# Patient Record
Sex: Female | Born: 1960 | Race: Asian | Marital: Married | Smoking: Never smoker
Health system: Southern US, Community
[De-identification: ages and names within clinical notes are randomized; demographics above are authoritative.]

---

## 2021-05-03 ENCOUNTER — Other Ambulatory Visit: Payer: Self-pay | Admitting: Emergency Medicine

## 2021-05-03 ENCOUNTER — Other Ambulatory Visit: Payer: Self-pay

## 2021-05-03 ENCOUNTER — Ambulatory Visit
Admission: RE | Admit: 2021-05-03 | Discharge: 2021-05-03 | Disposition: A | Discharge status: Home / Self Care | Payer: Commercial Managed Care - PPO | Source: Ambulatory Visit | Attending: Emergency Medicine | Admitting: Emergency Medicine

## 2021-05-03 DIAGNOSIS — M25532 Pain in left wrist: Secondary | ICD-10-CM

## 2022-03-23 ENCOUNTER — Encounter: Payer: Self-pay | Admitting: Family Medicine

## 2022-03-23 ENCOUNTER — Ambulatory Visit: Payer: Self-pay

## 2022-03-23 ENCOUNTER — Ambulatory Visit (INDEPENDENT_AMBULATORY_CARE_PROVIDER_SITE_OTHER): Payer: Commercial Managed Care - PPO | Admitting: Family Medicine

## 2022-03-23 VITALS — BP 94/64 | HR 56 | Ht 67.0 in | Wt 169.0 lb

## 2022-03-23 DIAGNOSIS — M25471 Effusion, right ankle: Secondary | ICD-10-CM | POA: Diagnosis not present

## 2022-03-23 NOTE — Patient Instructions (Addendum)
Nice to meet you today. ? ?I've ordered a venous doppler ultrasound to check for blood clots. ? ?Please use Voltaren gel (Generic Diclofenac Gel) up to 4x daily for pain as needed.  This is available over-the-counter as both the name brand Voltaren gel and the generic diclofenac gel.  ? ?Follow-up: 2 weeks ?

## 2022-03-23 NOTE — Progress Notes (Signed)
? ?  I, Christoper Fabian, LAT, ATC, am serving as scribe for Dr. Clementeen Graham. ? ?Subjective:   ? ?CC: R ankle pain ? ?HPI: Pt is a 61 y/o female c/o R swelling x one month w/ no known MOI.  However, she did notice the swelling after returning from a cross country flight to/from [place].  Pt locates swelling to her R med and lateral ankle w/ some venous distension noted at her medial ankle.  While in [place] she did a lot of sitting watching tennis. ? ?R ankle swelling: yes and into her r foot as well ?Aggravates: pressure to the R medial ankle; prolonged exercise ?Treatments tried: R ankle AROM ? ?Pertinent review of Systems: No fevers or chills ? ?Relevant historical information: Otherwise healthy ? ? ?Objective:   ? ?Vitals:  ? 03/23/22 1415  ?BP: 94/64  ?Pulse: (!) 56  ?SpO2: 94%  ? ?General: Well Developed, well nourished, and in no acute distress.  ? ?MSK: Right foot and ankle: Mild erythema swelling and tenderness to the medial ankle and foot. ?Minimal venous distention present in this area. ?Normal foot and ankle motion. ?Nontender. ?Calf not particularly tender or swollen. ?Pulses are intact distally. ? ? ? ? ?Impression and Recommendations:   ? ?Assessment and Plan: ?61 y.o. female with right medial ankle pain and swelling.  This occurs in the setting of a cross-country flight and a fair amount of sitting.  This is somewhat concerning for DVT.  Plan for vascular ultrasound to evaluate for DVT.Marland Kitchen ?To recheck in 2 weeks.  Also recommend Voltaren gel. ? ? ?Discussed warning signs or symptoms. Please see discharge instructions. Patient expresses understanding. ? ? ?The above documentation has been reviewed and is accurate and complete Clementeen Graham, M.D. ? ?

## 2022-03-24 ENCOUNTER — Telehealth: Payer: Self-pay | Admitting: Family Medicine

## 2022-03-24 ENCOUNTER — Ambulatory Visit (HOSPITAL_COMMUNITY)
Admission: RE | Admit: 2022-03-24 | Discharge: 2022-03-24 | Disposition: A | Discharge status: Home / Self Care | Payer: Commercial Managed Care - PPO | Source: Ambulatory Visit | Attending: Family Medicine | Admitting: Family Medicine

## 2022-03-24 DIAGNOSIS — M25471 Effusion, right ankle: Secondary | ICD-10-CM

## 2022-03-24 MED ORDER — APIXABAN 5 MG PO TABS: 5.0000 mg | ORAL_TABLET | Freq: Two times a day (BID) | ORAL | 0 refills | Status: DC

## 2022-03-24 MED ORDER — APIXABAN (ELIQUIS) VTE STARTER PACK (10MG AND 5MG): ORAL_TABLET | ORAL | 0 refills | Status: DC

## 2022-03-24 NOTE — Telephone Encounter (Signed)
[facility] called with a preliminary report for venous doppler done today. Patient is positive for acute DVT in right popliteal vein. ? ?Advised patient that Dr Denyse Amass would be in contact with her on next steps. ? ?

## 2022-03-24 NOTE — Telephone Encounter (Signed)
Called pt about DVT. Rx Eliquis. Provided my cell number. Recheck with me in 2 weeks as scheduled.  ?

## 2022-03-27 NOTE — Progress Notes (Signed)
Vascular ultrasound of the right leg shows a DVT.  We discussed this on the phone on Friday..  Please start the blood thinner Eliquis that I prescribed.

## 2022-04-03 NOTE — Progress Notes (Signed)
? ?I, Wendy Poet, LAT, ATC, am serving as scribe for Dr. Lynne Leader. ? ?Ana Crawford is a 61 y.o. female who presents to [facility] today for f/u of R ankle swelling due to a popliteal vein DVT/  She was last seen by Dr. Georgina Snell on 03/23/22 and was referred for a doppler US after noticing R ankle and foot swelling following a cross-country flight to and from [place].  She was prescribed eliquis.  Today, pt reports that she is feeling well and thinks the R ankle swelling is improving.  She con't to take her Eliquis and will be leaving tomorrow to fly to [place]. ? ?She is already found a doctor at [facility] that she will establish care with (Dr Brigitte Pulse) ? ?Diagnostic testing: R LE venous doppler US- 03/24/22 ? ?Pertinent review of systems: No fevers or chills ? ?Relevant historical information: No prior DVT ? ? ?Exam:  ?BP 100/62 (BP Location: Right Arm, Patient Position: Sitting, Cuff Size: Normal)   Pulse (!) 50   Ht 5\' 7"  (1.702 m)   Wt 169 lb 12.8 oz (77 kg)   SpO2 95%   BMI 26.59 kg/m?  ?General: Well Developed, well nourished, and in no acute distress.  ? ?MSK: Right leg minimal swelling nontender. ? ? ? ?Lab and Radiology Results ?Lower Venous DVT Study  ? ?Patient Name:  Ana Crawford  Date of Exam:   03/24/2022  ?Medical Rec #: [id number]        Accession #:    [id number]  ?Date of Birth: May 05, 1961        Patient Gender: F  ?Patient Age:   61 years  ?Exam Location:  [facility]  ?Procedure:      VAS [place] LOWER EXTREMITY VENOUS (DVT)  ?Referring Phys: Ellard Artis Deshanti Adcox  ? ? ?---------------------------------------------------------------------------  ?-----  ?   ?Indications: Edema involving the right ankle for one week. Patient denies  ?pain.  ?   ?Performing Technologist: Ronal Fear RVS, RCS  ? ?   ?Examination Guidelines:  ?A complete evaluation includes B-mode imaging, spectral Doppler, color  ?Doppler,  ?and power Doppler as needed of all  accessible portions of each vessel.  ?Bilateral  ?testing is considered an integral part of a complete examination. Limited  ?examinations for reoccurring indications may be performed as noted. The  ?reflux  ?portion of the exam is performed with the patient in reverse  ?Trendelenburg.  ? ?   ? ?+---------+---------------+---------+-----------+---------------+----------  ?----+  ?RIGHT    CompressibilityPhasicitySpontaneityProperties     Thrombus  ?Aging  ?+---------+---------------+---------+-----------+---------------+----------  ?----+  ?CFV      Full                                                          ?      ?+---------+---------------+---------+-----------+---------------+----------  ?----+  ?SFJ      Full                                                          ?      ?+---------+---------------+---------+-----------+---------------+----------  ?----+  ?FV Prox  Full                                                          ?      ?+---------+---------------+---------+-----------+---------------+----------  ?----+  ?  FV Mid   Full                                                          ?      ?+---------+---------------+---------+-----------+---------------+----------  ?----+  ?FV DistalFull                                                          ?      ?+---------+---------------+---------+-----------+---------------+----------  ?----+  ?POP      Partial                            softly         Acute      ?      ?                                            echogenic        ?non-occlusive   ?+---------+---------------+---------+-----------+---------------+----------  ?----+  ?PTV      Full                                                          ?      ?+---------+---------------+---------+-----------+---------------+----------  ?----+  ?PERO     Full                                                          ?       ?+---------+---------------+---------+-----------+---------------+----------  ?----+  ?GSV      Full                                                          ?      ?+---------+---------------+---------+-----------+---------------+----------  ?----+  ?SSV      Full                                                          ?      ?+---------+---------------+---------+-----------+---------------+----------  ?----+  ? ?   ? ?   ? ? ? ?   ? ?Findings reported to [facility] at 2:15 pm.  ?   ?Summary:  ?RIGHT:  ?- Findings consistent with acute, non-occlusive deep vein thrombosis  ?involving the right popliteal vein.  ?- There is  no evidence of superficial venous thrombosis.  ?   ?LEFT:  ?- No evidence of common femoral vein obstruction.  ?   ? ?*See table(s) above for measurements and observations.  ? ?Electronically signed by Orlie Pollen on 03/24/2022 at 5:48:39 PM.  ?   ? ? ?  Final   ? ? ? ?Assessment and Plan: ?61 y.o. female with right popliteal DVT.  Patient was treated with Eliquis and is currently on the maintenance dose.  This is her first DVT and I believe it is provoked by a relative period of exercise followed by immobilization.  It is important for her to establish care with her primary care provider.  She already has selected Dr. Brigitte Pulse at [facility].  It is appropriate for him to take over anticoagulation.  If she has any issues I be happy to assist and continue prescribing Eliquis for a 3 to 53-month..  Recheck back with me as needed.  We discussed exercise.  Recommend compression stockings and graduated return to exercise at this time. ? ? ?PDMP not reviewed this encounter. ?No orders of the defined types were placed in this encounter. ? ?Meds ordered this encounter  ?Medications  ? apixaban (ELIQUIS) 5 MG TABS tablet  ?  Sig: Take 1 tablet (5 mg total) by mouth 2 (two) times daily.  ?  Dispense:  180 tablet  ?  Refill:  0  ? ? ? ?Discussed warning signs or symptoms. Please see  discharge instructions. Patient expresses understanding. ? ? ?The above documentation has been reviewed and is accurate and complete Lynne Leader, M.D. ? ? ?

## 2022-04-04 ENCOUNTER — Ambulatory Visit (INDEPENDENT_AMBULATORY_CARE_PROVIDER_SITE_OTHER): Payer: Commercial Managed Care - PPO | Admitting: Family Medicine

## 2022-04-04 ENCOUNTER — Encounter: Payer: Self-pay | Admitting: Family Medicine

## 2022-04-04 VITALS — BP 100/62 | HR 50 | Ht 67.0 in | Wt 169.8 lb

## 2022-04-04 DIAGNOSIS — J301 Allergic rhinitis due to pollen: Secondary | ICD-10-CM | POA: Insufficient documentation

## 2022-04-04 DIAGNOSIS — T781XXA Other adverse food reactions, not elsewhere classified, initial encounter: Secondary | ICD-10-CM | POA: Insufficient documentation

## 2022-04-04 DIAGNOSIS — I82431 Acute embolism and thrombosis of right popliteal vein: Secondary | ICD-10-CM | POA: Insufficient documentation

## 2022-04-04 DIAGNOSIS — K529 Noninfective gastroenteritis and colitis, unspecified: Secondary | ICD-10-CM | POA: Insufficient documentation

## 2022-04-04 MED ORDER — APIXABAN 5 MG PO TABS: 5.0000 mg | ORAL_TABLET | Freq: Two times a day (BID) | ORAL | 0 refills | Status: DC

## 2022-04-04 NOTE — Patient Instructions (Addendum)
Good to see you today. ? ?Recommend that you establish care w/ a primary care provider.   ? ?Follow-up: as needed.  ? ?Continue Eliquis for at least 3-6 months as guided by your primary doctor.  ?

## 2022-04-06 ENCOUNTER — Ambulatory Visit: Payer: Commercial Managed Care - PPO | Admitting: Family Medicine

## 2022-07-07 ENCOUNTER — Other Ambulatory Visit: Payer: Self-pay | Admitting: Family Medicine

## 2023-03-18 IMAGING — CR DG WRIST COMPLETE 3+V*L*
5 series · 5 of 5 positions shown · non-contrast
Comparison: None.

CLINICAL DATA: Left wrist pain

EXAM:
LEFT WRIST - COMPLETE 3+ VIEW

[x wrist pa left]
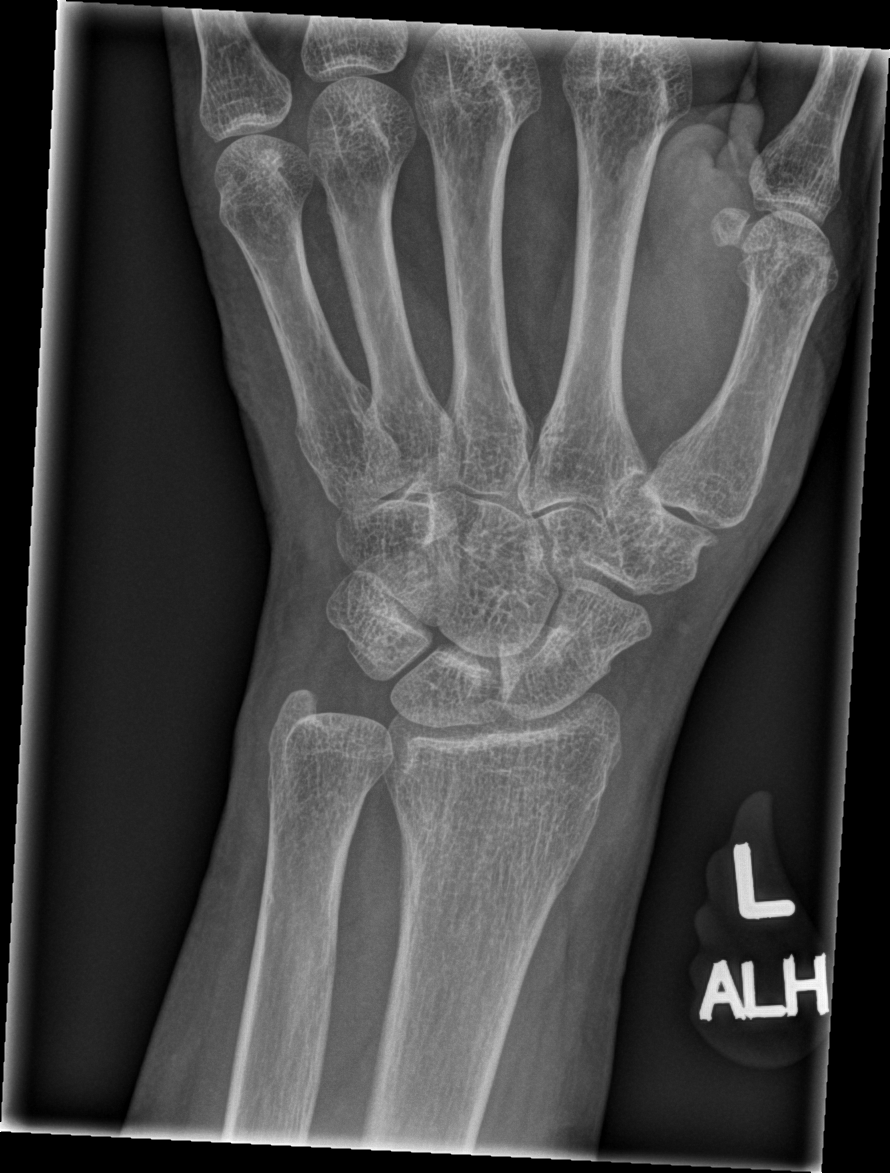

[x wrist obl left]
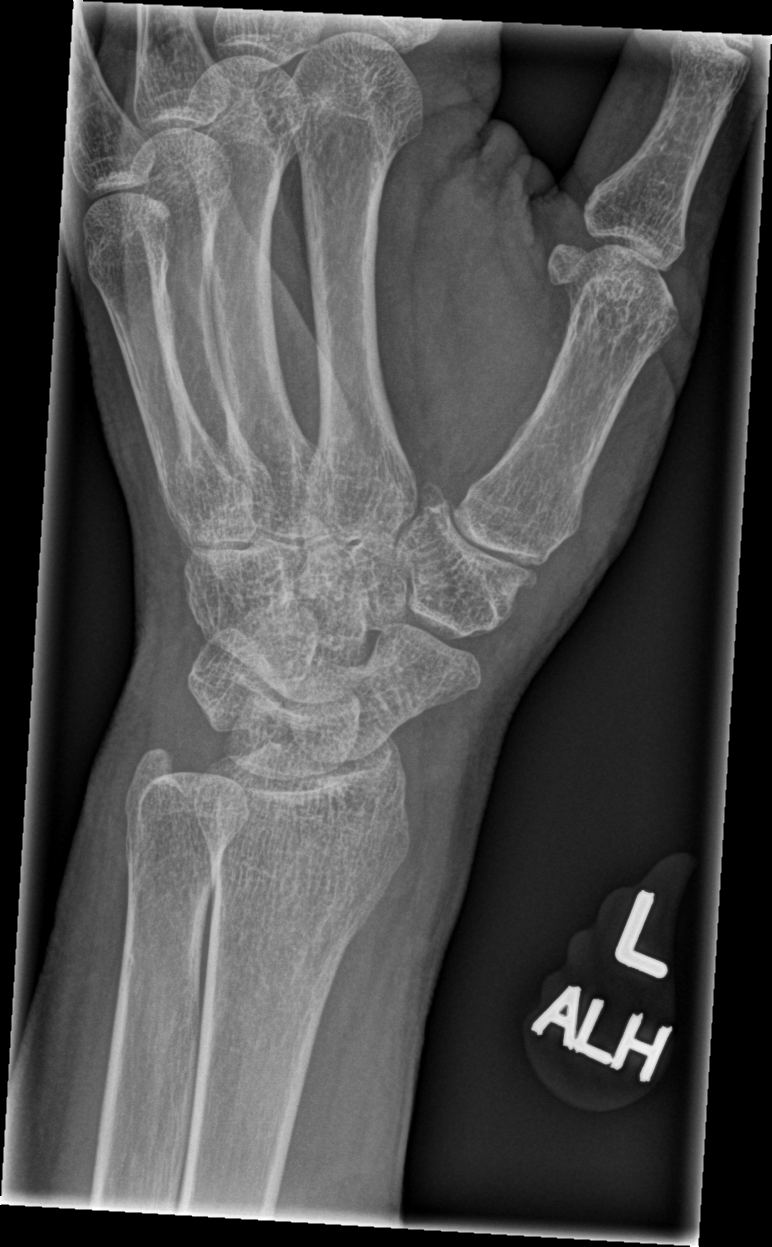

[x wrist lat left (1 of 2)]
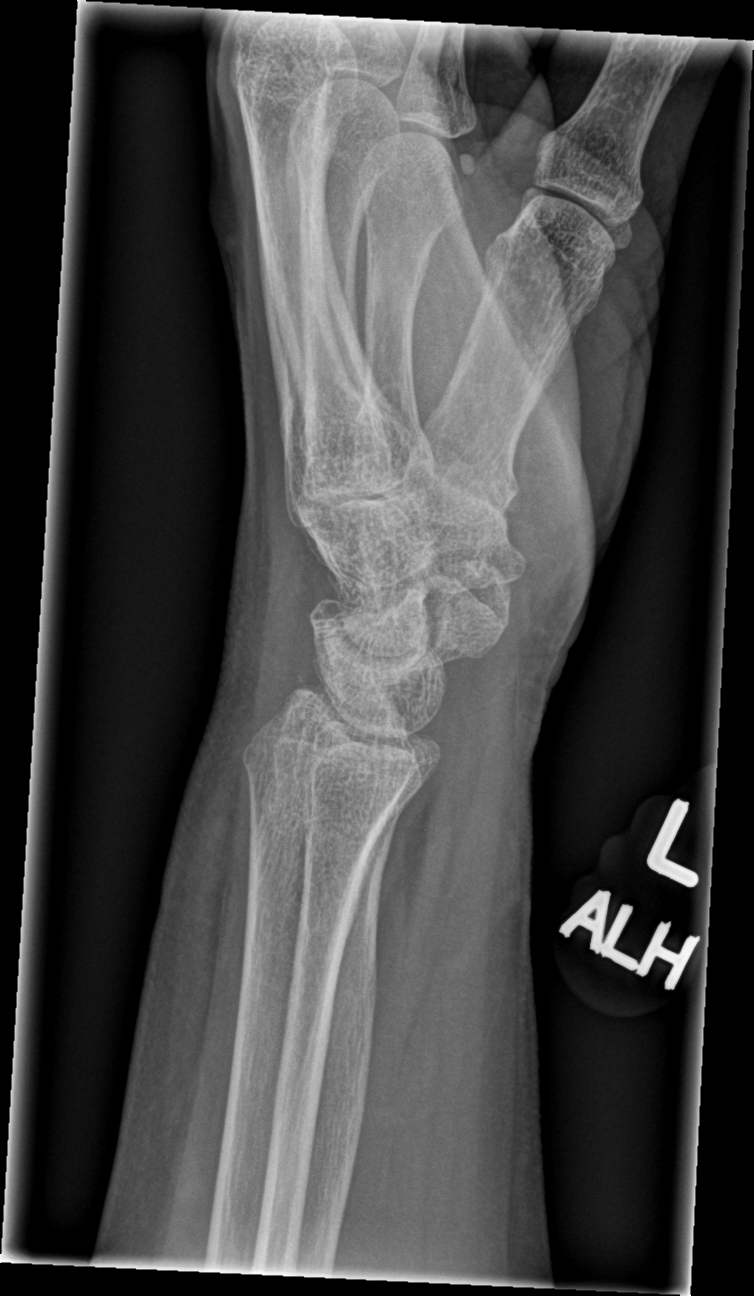

[x wrist navicular view left]
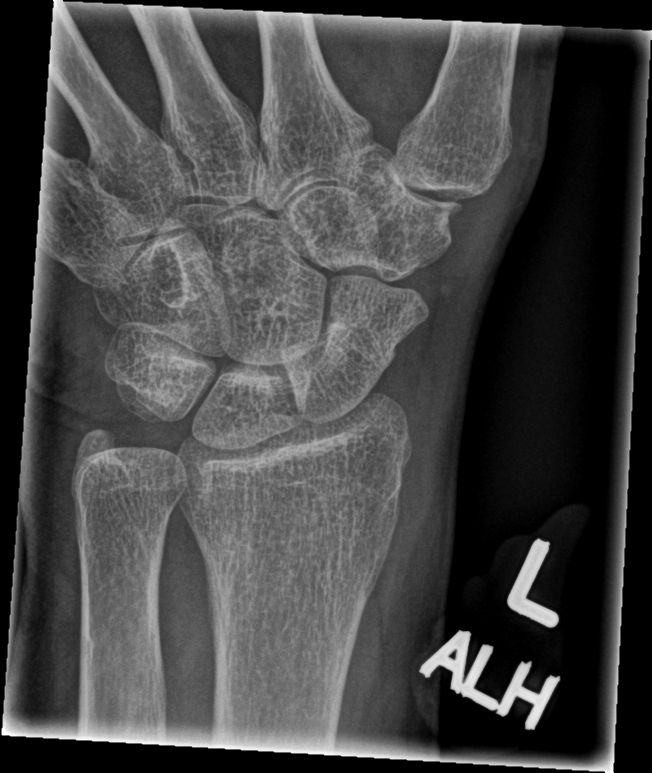

[x wrist lat left (2 of 2)]
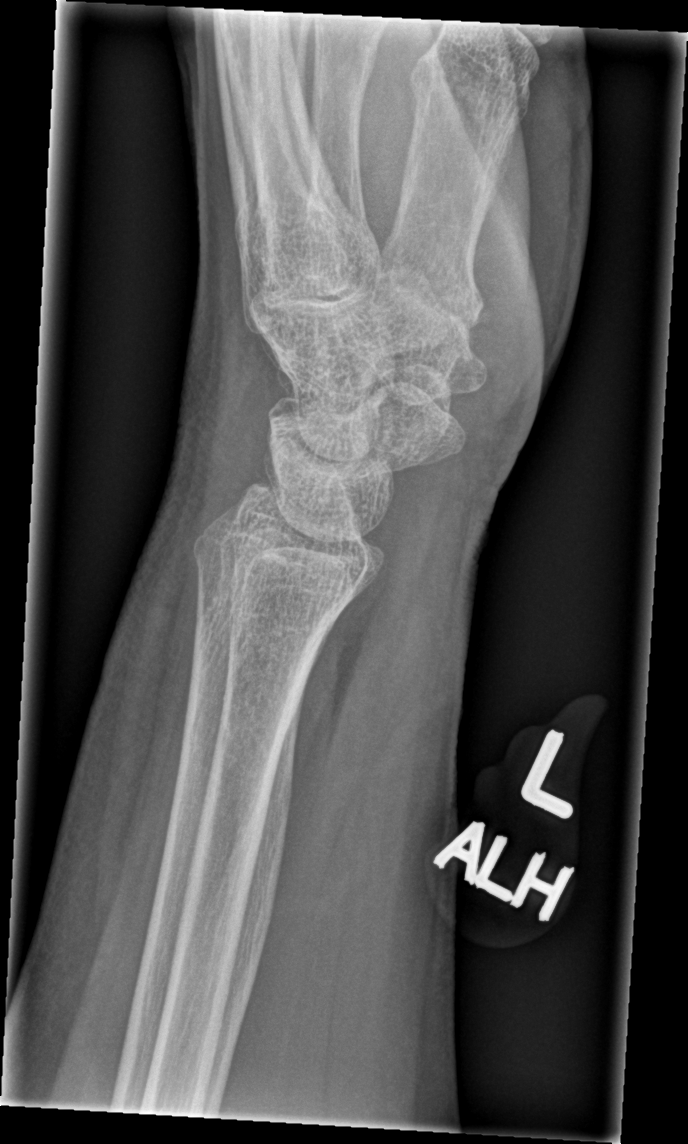

[5 of 5 positions shown; findings below may reference images not displayed]

FINDINGS: There is no evidence of fracture or dislocation. There is no
evidence of arthropathy or other focal bone abnormality. Soft
tissues are unremarkable.
IMPRESSION: Negative.

## 2023-07-25 ENCOUNTER — Other Ambulatory Visit: Payer: Self-pay | Admitting: Family Medicine

## 2023-07-25 ENCOUNTER — Encounter: Payer: Self-pay | Admitting: Family Medicine

## 2023-07-25 DIAGNOSIS — Z87898 Personal history of other specified conditions: Secondary | ICD-10-CM

## 2023-08-16 ENCOUNTER — Inpatient Hospital Stay: Admission: RE | Admit: 2023-08-16 | Payer: Commercial Managed Care - PPO | Source: Ambulatory Visit
